# Patient Record
Sex: Female | Born: 1980 | Race: White | Hispanic: No | Marital: Married | Smoking: Former smoker
Health system: Southern US, Community
[De-identification: ages and names within clinical notes are randomized; demographics above are authoritative.]

## PROBLEM LIST (undated history)

## (undated) DIAGNOSIS — R87619 Unspecified abnormal cytological findings in specimens from cervix uteri: Secondary | ICD-10-CM

## (undated) HISTORY — PX: WISDOM TOOTH EXTRACTION: SHX21

## (undated) HISTORY — DX: Unspecified abnormal cytological findings in specimens from cervix uteri: R87.619

---
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
## 1999-11-17 ENCOUNTER — Other Ambulatory Visit: Admission: RE | Admit: 1999-11-17 | Discharge: 1999-11-17 | Payer: Self-pay | Admitting: *Deleted

## 2002-07-19 ENCOUNTER — Other Ambulatory Visit: Admission: RE | Admit: 2002-07-19 | Discharge: 2002-07-19 | Payer: Self-pay | Admitting: Obstetrics and Gynecology

## 2002-11-14 ENCOUNTER — Other Ambulatory Visit: Admission: RE | Admit: 2002-11-14 | Discharge: 2002-11-14 | Payer: Self-pay | Admitting: Obstetrics and Gynecology

## 2003-05-07 ENCOUNTER — Other Ambulatory Visit: Admission: RE | Admit: 2003-05-07 | Discharge: 2003-05-07 | Payer: Self-pay | Admitting: Obstetrics and Gynecology

## 2003-09-04 ENCOUNTER — Other Ambulatory Visit: Admission: RE | Admit: 2003-09-04 | Discharge: 2003-09-04 | Payer: Self-pay | Admitting: Obstetrics and Gynecology

## 2004-04-10 ENCOUNTER — Encounter: Admission: RE | Admit: 2004-04-10 | Discharge: 2004-04-10 | Payer: Self-pay | Admitting: *Deleted

## 2004-07-14 ENCOUNTER — Encounter (INDEPENDENT_AMBULATORY_CARE_PROVIDER_SITE_OTHER): Payer: Self-pay | Admitting: Specialist

## 2004-07-14 ENCOUNTER — Inpatient Hospital Stay (HOSPITAL_COMMUNITY): Admission: AD | Admit: 2004-07-14 | Discharge: 2004-07-18 | Payer: Self-pay | Admitting: *Deleted

## 2005-09-16 IMAGING — US US ABDOMEN COMPLETE
1 series · 14 of 25 positions shown · non-contrast
Comparison: none

CLINICAL DATA: Right upper quadrant pain.  The patient is 25 weeks pregnant.
 COMPLETE ABDOMINAL ULTRASOUND ? 04/10/04:
 The gallbladder is well visualized and appears normal.  Common bile duct is normal at 3.7 mm in diameter.  Liver parenchyma, inferior vena cava, pancreas, and spleen are normal.  Kidneys are normal.  The visualized portion of the abdominal aorta is normal.  Portions of the kidneys and abdominal aorta are partially obscured by the pregnancy.  Right kidney is 10.9 cm in length and the left kidney is 11.3 cm in length.

[Series 1: unknown · 0.30mm/px · 14 of 62 slices shown]
[im 1/62]
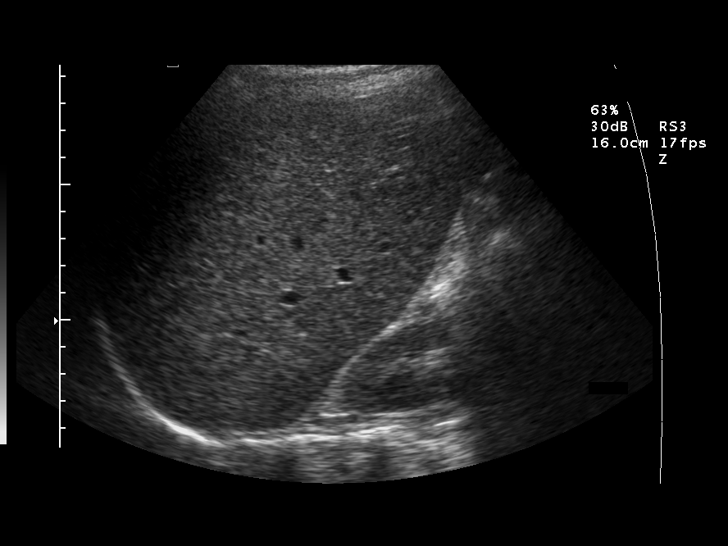
[im 6/62]
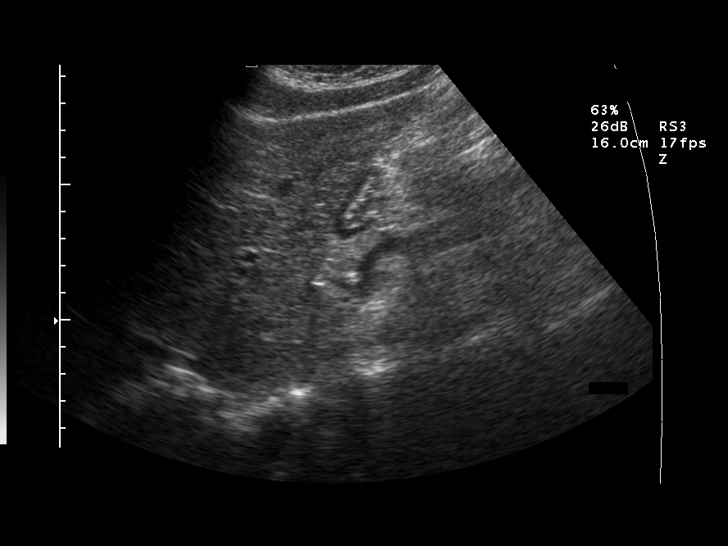
[im 11/62]
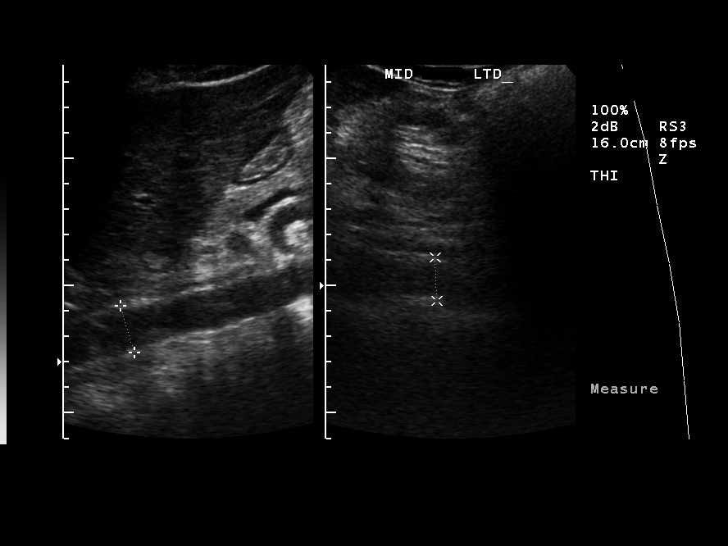
[im 16/62]
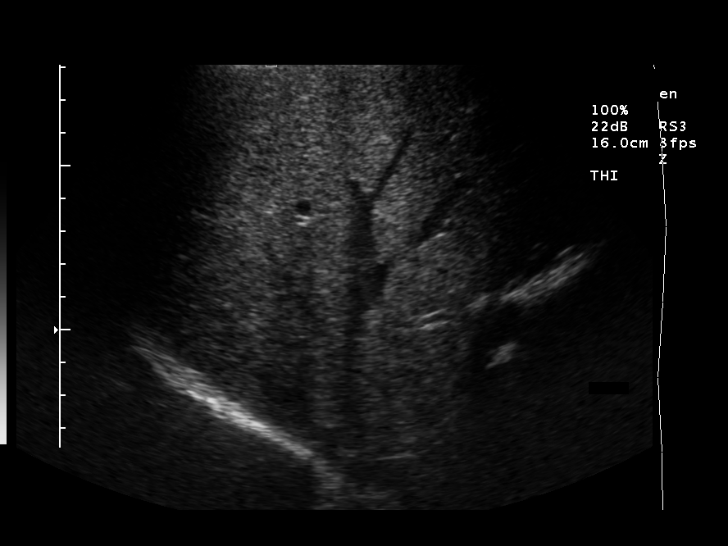
[im 21/62]
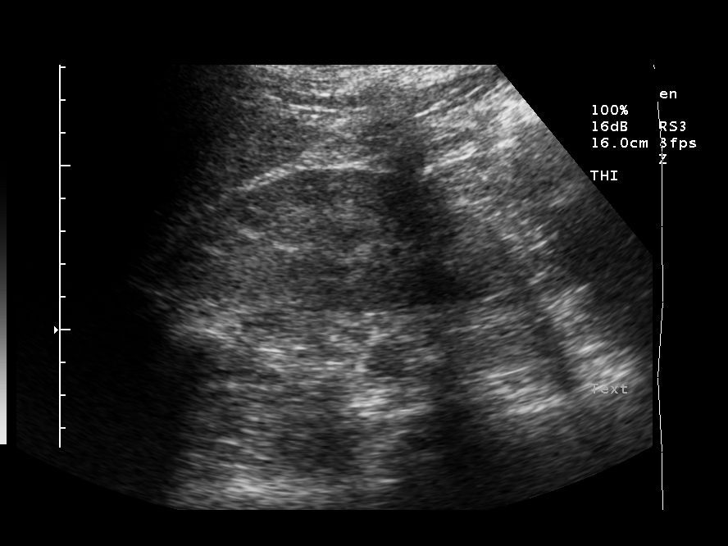
[im 23/62]
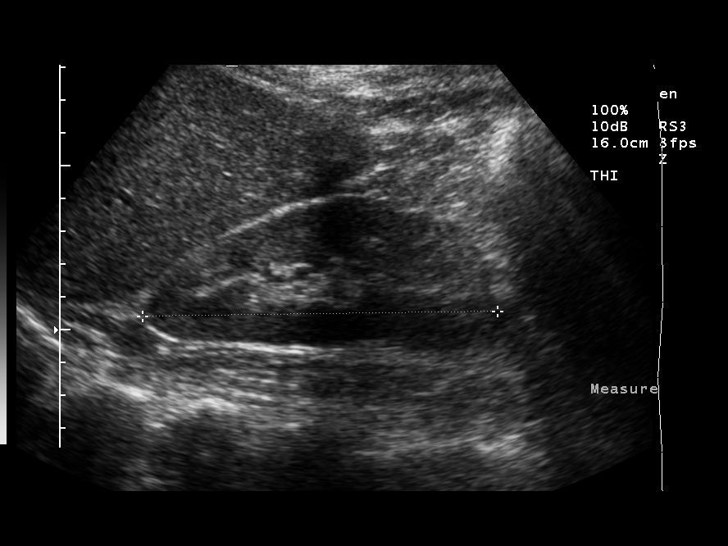
[im 28/62]
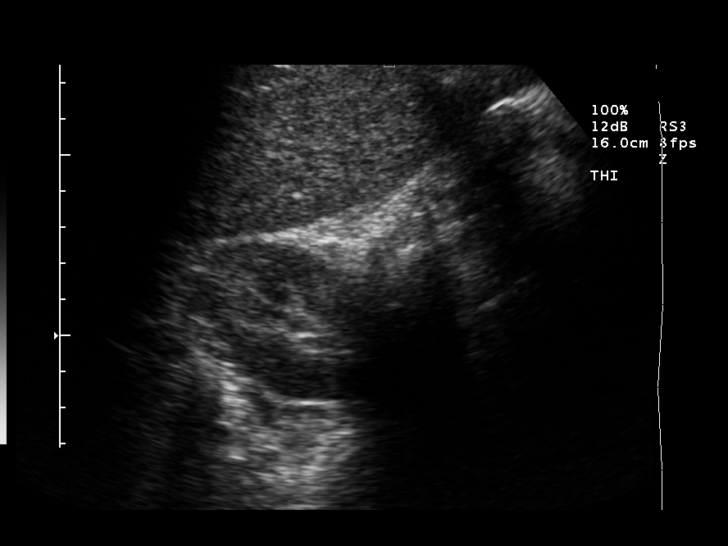
[im 34/62]
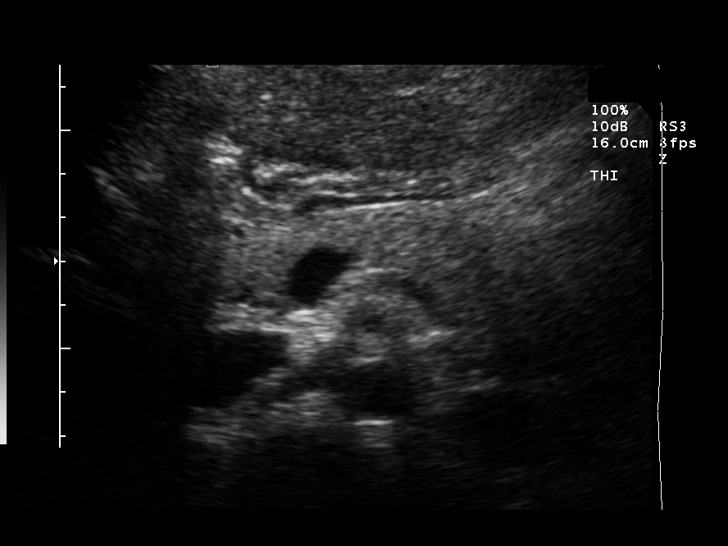
[im 39/62]
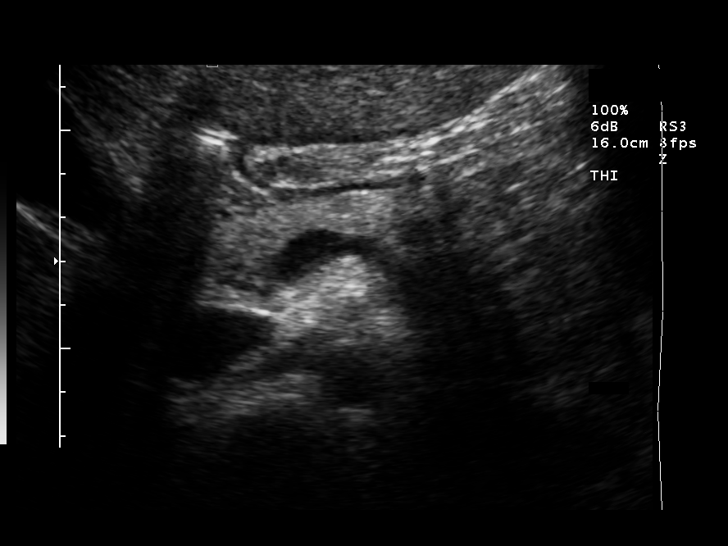
[im 41/62]
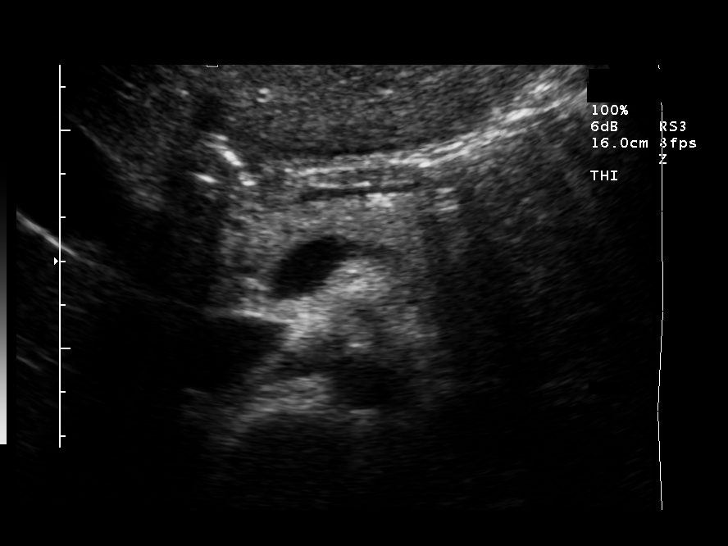
[im 46/62]
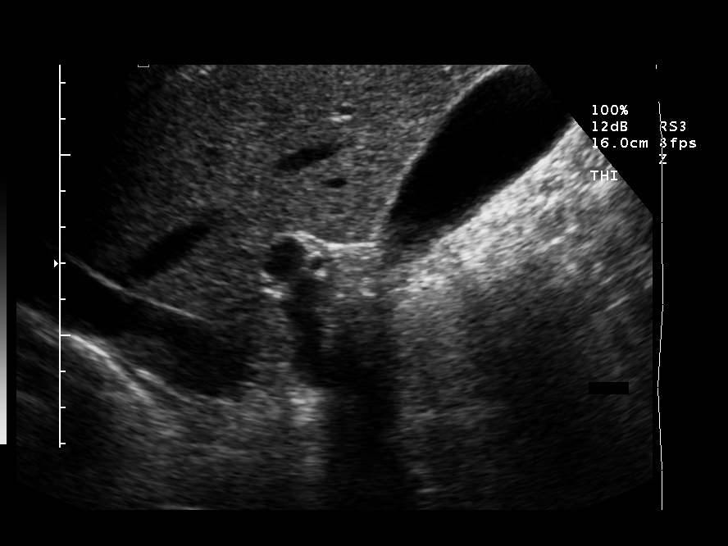
[im 51/62]
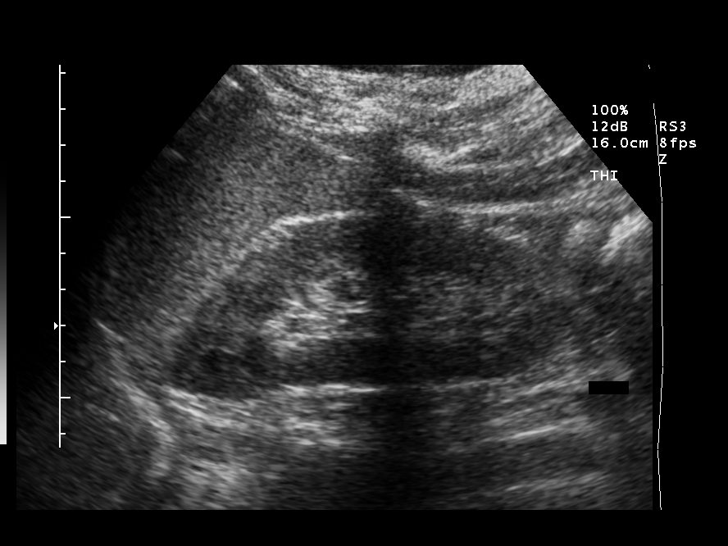
[im 56/62]
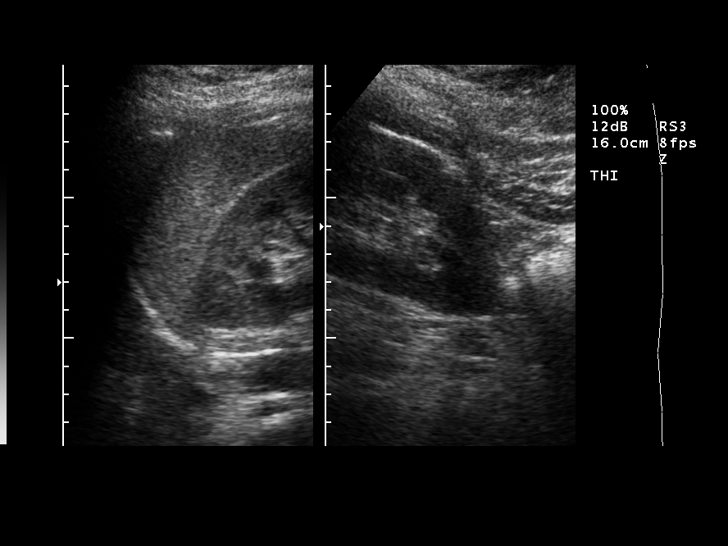
[im 62/62]
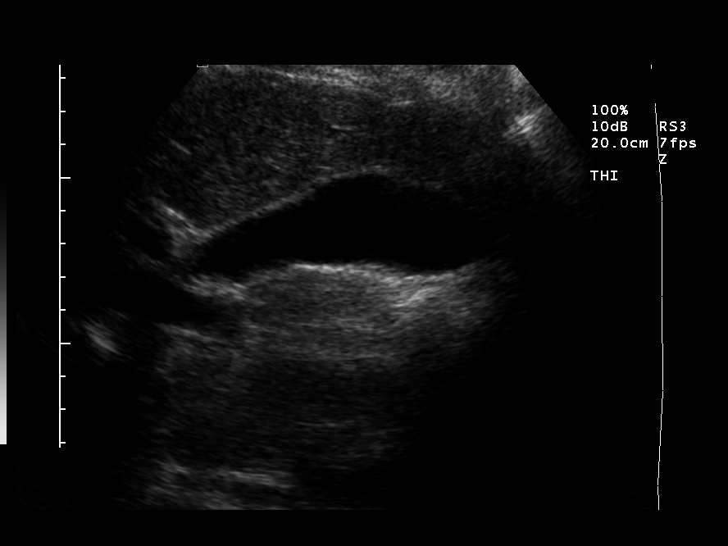

[14 of 25 positions shown; findings below may reference images not displayed]

IMPRESSION: Normal abdominal ultrasound.

## 2006-12-04 ENCOUNTER — Inpatient Hospital Stay (HOSPITAL_COMMUNITY): Admission: AD | Admit: 2006-12-04 | Discharge: 2006-12-06 | Payer: Self-pay | Admitting: Family Medicine

## 2006-12-04 ENCOUNTER — Encounter (INDEPENDENT_AMBULATORY_CARE_PROVIDER_SITE_OTHER): Payer: Self-pay | Admitting: Obstetrics & Gynecology

## 2010-10-03 ENCOUNTER — Encounter: Payer: Self-pay | Admitting: Emergency Medicine

## 2010-10-03 ENCOUNTER — Inpatient Hospital Stay (INDEPENDENT_AMBULATORY_CARE_PROVIDER_SITE_OTHER)
Admission: RE | Admit: 2010-10-03 | Discharge: 2010-10-03 | Disposition: A | Discharge status: Home / Self Care | Payer: BLUE CROSS/BLUE SHIELD | Source: Ambulatory Visit | Attending: Emergency Medicine | Admitting: Emergency Medicine

## 2010-10-03 DIAGNOSIS — J069 Acute upper respiratory infection, unspecified: Secondary | ICD-10-CM

## 2010-10-03 DIAGNOSIS — J209 Acute bronchitis, unspecified: Secondary | ICD-10-CM | POA: Insufficient documentation

## 2010-11-14 NOTE — H&P (Signed)
NAMEJENCY, Leah Browning                   ACCOUNT NO.:  [id number]   MEDICAL RECORD NO.:  [id number]          PATIENT TYPE:  MAT   LOCATION:  [facility]                          FACILITY:  [facility]   PHYSICIAN:  Lenoard Aden, M.D.DATE OF BIRTH:  03-Sep-1980   DATE OF ADMISSION:  07/14/2004  DATE OF DISCHARGE:                                HISTORY & PHYSICAL   CHIEF COMPLAINT:  Bleeding.   HISTORY OF PRESENT ILLNESS:  The patient is a 30 year old white female,  gravida 1, para 0, EDD July 21, 2004, who presents with heavy vaginal  bleeding, starting this evening.  Upon presentation to the hospital, she  does have a history of placenta previa followed by an ultrasound at 33 weeks  with a low-lying placenta about 1.5 cm from the cervical os.  She has had  some minor bloody show this afternoon followed by heavier bleeding as noted  this evening.   ALLERGIES:  No known drug allergies.   MEDICATIONS:  Prenatal vitamins.   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   FAMILY HISTORY:  Bladder cancer, heart disease, and diabetes.   PRENATAL LABORATORY DATA:  Blood type of A positive, Rh antibody negative,  rubella immune, hepatitis not available.   Prenatal course complicated for placenta previa as noted and an ultrasound  on June 04, 2004, consistent with a placenta that is posterior, but 1.2  cm from the cervical os.   PHYSICAL EXAMINATION:  GENERAL:  She is a well-developed, well-nourished,  white female in no acute distress.  The patient was complaining of some low  back pain.  HEENT:  Normal.  LUNGS:  Clear.  HEART:  Regular rate and rhythm.  ABDOMEN:  Soft, gravid, and nontender.  Estimated fetal weight 7.5 to 8  pounds.  PELVIC:  Cervix is 4 cm and 100% effaced.  Speculum examination reveals  about 50 mL of dark clotted blood with active bleeding noted.  Palpably  there is some questionable evidence of placenta palpated posteriorly and  upon palpation  of the lower posterior uterine segment, there is a large  amount of blood clot extruded from the cervical os.  EXTREMITIES:  No cords.  NEUROLOGY:  Nonfocal.   LABORATORY DATA:  CBC reveals a white blood cell count of 18.3, hemoglobin  14.7, hematocrit 42.9, and platelet count 261.   IMPRESSION:  1.  39-week pregnancy.  2.  Persistent placenta previa versus low-lying placenta with marginal      placental abruption and increased third trimester bleeding.  3.  Reassuring fetal surveillance noted.   PLAN:  Risks and benefits were discussed with the patient and we will  proceed with a primary cesarean section due to concerns regarding possible  placental abruption and/or persistent placenta previa.  The patient  acknowledges and will proceed.     Rich   RJT/MEDQ  D:  07/14/2004  T:  07/14/2004  Job:  [id number]

## 2010-11-14 NOTE — Op Note (Signed)
Leah Browning, CHATHAM                   ACCOUNT NO.:  [id number]   MEDICAL RECORD NO.:  [id number]          PATIENT TYPE:  MAT   LOCATION:  [facility]                          [facility]ILITY:  [facility]   PHYSICIAN:  Lenoard Aden, M.D.DATE OF BIRTH:  22-Jan-1981   DATE OF PROCEDURE:  07/15/2004  DATE OF DISCHARGE:                                 OPERATIVE REPORT   PREOPERATIVE DIAGNOSES:  A 39 week intra-uterine pregnancy, unexplained  third trimester bleeding, history of placenta previa versus possible  placental abruption.   POSTOPERATIVE DIAGNOSES:  A 39 week intra-uterine pregnancy, unexplained  third trimester bleeding, history of placenta previa versus possible  placental abruption, probable marginal placenta previa.   PROCEDURE:  Primary low transverse segment cesarean section.   SURGEON:  Dr. Billy Coast.   ANESTHESIA:  Spinal.   ESTIMATED BLOOD LOSS:  1500 cc.   COMPLICATIONS:  None.   DRAINS:  Foley.   COUNTS:  Correct.   The patient to recovery in good condition.   FINDINGS:  Full-time living female, occiput-anterior position, Apgars 8 and 9.  Cord pH 7.30, probable post marginal posterior placenta previa delivered  manually intact.  Three vessel cord.  Normal tubes and ovaries.  Uterus  closed in two layers.  The patient to recovery in good condition.  Placenta  to pathology.   BRIEF OPERATIVE NOTE:  Being apprised of the risks of anesthesia, infection,  bleeding, intra-abdominal injury and need for repair, the patient was  brought to the operating room where she was administered the spinal  anesthetic without complications, prepped and draped in the usual sterile  fashion.  The Foley catheter placed.  After achieving adequate anesthesia,  dilute Marcaine solution placed in the area of the Pfannenstiel skin  incision, then made with the scalpel and carried down to the fascia which  was nicked in the midline and extended transversely using Mayo scissors.  The rectus muscles  dissected sharply in the midline. The peritoneum entered  sharply, the bladder blade placed.  The visceral peritoneum scored in a  smile-like fashion.  A curved hysterotomy incision made, atraumatic delivery  of full-term living female, delivered with the assistance of a vacuum x1 pull,  handed to the pediatricians in attendance, Apgars 8 and 9.  Cord pH 7.30.  Placenta posterior as noted and prompt marginal placental previa detached  without difficulty.  Bleeding from the lower uterine segment is observed and  found to be well controlled after a period of observation with good, with  application of IV uterotonic's in the form of IV Pitocin.  Good harmonics  scalpel is noted, therefore the uterus is closed in two layers using 0  Monocryl suture, second imbricating layer placed.  The incision inspected  and found to be hemostatic.  The bladder flap inspected and found to be dry.  At this time, the uterus is replaced in the abdominal cavity.  Irrigation is  accomplished.  The fascia closed using the 0 Monocryl in a continuous  running fashion.  The skin closed using staples.  The patient tolerated the  procedure well and  is transferred to recovery in good condition.     Rich   RJT/MEDQ  D:  07/15/2004  T:  07/15/2004  Job:  [id number]

## 2010-11-14 NOTE — Discharge Summary (Signed)
NAMEJUDYTH, DEMARAIS                   ACCOUNT NO.:  [id number]   MEDICAL RECORD NO.:  [id number]          PATIENT TYPE:  INP   LOCATION:  9120                          [facility]ACILITY:  [facility]   PHYSICIAN:  Lenoard Aden, M.D.DATE OF BIRTH:  [birth year]   DATE OF ADMISSION:  07/14/2004  DATE OF DISCHARGE:  07/18/2004                                 DISCHARGE SUMMARY   HOSPITAL COURSE:  The patient underwent uncomplicated primary C-section on  July 15, 2004.  Postoperative course was uncomplicated.  She was  discharged to home on day #3.  Discharge teaching done.   CONDITION ON DISCHARGE:  Good.   DISCHARGE MEDICATIONS:  1.  Iron.  2.  Prenatal vitamins.  3.  Tylox.  4.  Iron.   FOLLOW UP:  Follow up in the office in 4-6 weeks.      RJT/MEDQ  D:  08/04/2004  T:  08/04/2004  Job:  [id number]

## 2011-04-16 LAB — CBC
HCT: 30.9 — ABNORMAL LOW
HCT: 39.1
Hemoglobin: 10.5 — ABNORMAL LOW
Hemoglobin: 13.2
MCHC: 33.7
MCHC: 33.9
MCV: 92.1
MCV: 92.9
Platelets: 174
Platelets: 208
RBC: 3.33 — ABNORMAL LOW
RBC: 4.25
RDW: 13
RDW: 13.2
WBC: 15.1 — ABNORMAL HIGH
WBC: 16.7 — ABNORMAL HIGH

## 2011-04-16 LAB — RPR: RPR Ser Ql: NONREACTIVE

## 2011-06-01 NOTE — Progress Notes (Signed)
Summary: COLD SYMPTOMS/WSE rm 4   Vital Signs:  Patient Profile:   30 Years Old Female CC:      Cold & URI symptoms Height:     66 inches Weight:      135.25 pounds O2 Sat:      98 % O2 treatment:    Room Air Temp:     99.5 degrees F oral Pulse rate:   83 / minute Resp:     18 per minute BP sitting:   93 / 47  (left arm) Cuff size:   regular  Vitals Entered By: Clemens Catholic LPN (October 03, 7827 1:06 PM)                  Updated Prior Medication List: No Medications Current Allergies: No known allergies History of Present Illness History from: patient Chief Complaint: Cold & URI symptoms History of Present Illness: 30 Years Old Female complains of onset of cold symptoms for 2-3 days.  Teffany has been using Mucinex, Sudafed, and OTC cough med which is helping a little bit. No sore throat + cough No pleuritic pain No wheezing +nasal congestion +post-nasal drainage + sinus pain/pressure + chest congestion No itchy/red eyes No earache No hemoptysis No SOB No chills/sweats + fever No nausea No vomiting No abdominal pain No diarrhea No skin rashes No fatigue No myalgias No headache   REVIEW OF SYSTEMS Constitutional Symptoms       Complains of fever, chills, and night sweats.     Denies weight loss, weight gain, and fatigue.  Eyes       Denies change in vision, eye pain, eye discharge, glasses, contact lenses, and eye surgery.      Comments: watery eyes Ear/Nose/Throat/Mouth       Complains of frequent runny nose, sinus problems, and hoarseness.      Denies hearing loss/aids, change in hearing, ear pain, ear discharge, dizziness, frequent nose bleeds, sore throat, and tooth pain or bleeding.  Respiratory       Complains of productive cough and wheezing.      Denies dry cough, shortness of breath, asthma, bronchitis, and emphysema/COPD.  Cardiovascular       Complains of chest pain and tires easily with exhertion.      Denies murmurs.    Gastrointestinal       Denies stomach pain, nausea/vomiting, diarrhea, constipation, blood in bowel movements, and indigestion. Genitourniary       Denies painful urination, kidney stones, and loss of urinary control. Neurological       Complains of headaches.      Denies paralysis, seizures, and fainting/blackouts. Musculoskeletal       Complains of muscle pain and joint pain.      Denies joint stiffness, decreased range of motion, redness, swelling, muscle weakness, and gout.  Skin       Denies bruising, unusual mles/lumps or sores, and hair/skin or nail changes.  Psych       Complains of anxiety/stress and depression.      Denies mood changes, temper/anger issues, speech problems, and sleep problems. Other Comments: pt c/o cough, chest congestion, fever last night, chest hurts to cough, runny nose and watery eyes x 2days. she has a hx of pneumonia. she has taken IBF, Sudafed, and Mucinex with no relief.   Past History:  Past Medical History: Unremarkable  Past Surgical History: Caesarean section 2006  Family History: father- heart dz, diabetes, Family History High cholesterol Family History Hypertension Family History  Depression mother- Family History Thyroid disease high cholestrol and diabetes  Social History: Never Smoked Alcohol use-no Drug use-no Smoking Status:  never Drug Use:  no Physical Exam General appearance: well developed, well nourished, no acute distress Ears: normal, no lesions or deformities Nasal: mucosa pink, nonedematous, no septal deviation, turbinates normal Oral/Pharynx: tongue normal, posterior pharynx without erythema or exudate Chest/Lungs: no rales, wheezes, or rhonchi bilateral, breath sounds equal without effort Heart: regular rate and  rhythm, no murmur MSE: oriented to time, place, and person Assessment New Problems: BRONCHITIS, ACUTE (ICD-466.0) FAMILY HISTORY DEPRESSION (ICD-V17.0) UPPER RESPIRATORY INFECTION, ACUTE (ICD-465.9)   Plan New  Medications/Changes: ZITHROMAX Z-PAK 250 MG TABS (AZITHROMYCIN) use as directed  #1 x 0, 10/03/2010, Hoyt Koch MD  New Orders: New Patient Level III [phone] Pulse Oximetry (single measurment) [94760] Planning Comments:   1)  Take the prescribed antibiotic as instructed. Should wait 1-2 days prior to taking 2)  Use nasal saline solution (over the counter) at least 3 times a day. 3)  Use over the counter decongestants like Zyrtec-D every 12 hours as needed to help with congestion. 4)  Can take tylenol every 6 hours or motrin every 8 hours for pain or fever. 5)  Follow up with your primary doctor  if no improvement in 5-7 days, sooner if increasing pain, fever, or new symptoms.   If worsening, would suggest CXR since you've had pneumonia in the past.   The patient and/or caregiver has been counseled thoroughly with regard to medications prescribed including dosage, schedule, interactions, rationale for use, and possible side effects and they verbalize understanding.  Diagnoses and expected course of recovery discussed and will return if not improved as expected or if the condition worsens. Patient and/or caregiver verbalized understanding.  Prescriptions: ZITHROMAX Z-PAK 250 MG TABS (AZITHROMYCIN) use as directed  #1 x 0   Entered and Authorized by:   Hoyt Koch MD   Signed by:   Hoyt Koch MD on 10/03/2010   Method used:   Print then Give to Patient   RxID:   [id number]   Orders Added: 1)  New Patient Level III [72536] 2)  Pulse Oximetry (single measurment) [64403]

## 2015-06-27 ENCOUNTER — Encounter: Payer: Self-pay | Admitting: Family Medicine

## 2015-07-08 ENCOUNTER — Encounter: Payer: Self-pay | Admitting: Obstetrics & Gynecology

## 2015-07-11 ENCOUNTER — Ambulatory Visit (INDEPENDENT_AMBULATORY_CARE_PROVIDER_SITE_OTHER): Payer: BLUE CROSS/BLUE SHIELD | Admitting: Obstetrics & Gynecology

## 2015-07-11 ENCOUNTER — Encounter: Payer: Self-pay | Admitting: Obstetrics & Gynecology

## 2015-07-11 VITALS — BP 104/49 | HR 65 | Resp 16 | Ht 66.0 in | Wt 163.0 lb

## 2015-07-11 DIAGNOSIS — Z124 Encounter for screening for malignant neoplasm of cervix: Secondary | ICD-10-CM | POA: Diagnosis not present

## 2015-07-11 DIAGNOSIS — Z1151 Encounter for screening for human papillomavirus (HPV): Secondary | ICD-10-CM | POA: Diagnosis not present

## 2015-07-11 DIAGNOSIS — Z0000 Encounter for general adult medical examination without abnormal findings: Secondary | ICD-10-CM

## 2015-07-11 DIAGNOSIS — Z01419 Encounter for gynecological examination (general) (routine) without abnormal findings: Secondary | ICD-10-CM | POA: Diagnosis not present

## 2015-07-11 NOTE — Progress Notes (Signed)
Subjective:    Leah Browning is a 35 y.o. MW P2 with step son also(20, 5211, and 138 yo sons) female who presents for an annual exam. The patient has no complaints today. She has some intermittent bilateral hip pain. She takes no meds. It self resolves. It started about 4-5 monthes. The patient is sexually active. GYN screening history: last pap: was normal. The patient wears seatbelts: yes. The patient participates in regular exercise: no. Has the patient ever been transfused or tattooed?: yes.(tatoo)  The patient reports that there is not domestic violence in her life.   Menstrual History: OB History    Gravida Para Term Preterm AB TAB SAB Ectopic Multiple Living   2 2 2       2       Menarche age: [age]  Patient's last menstrual period was 06/24/2015.    The following portions of the patient's history were reviewed and updated as appropriate: allergies, current medications, past family history, past medical history, past social history, past surgical history and problem list.  Review of Systems Pertinent items noted in HPI and remainder of comprehensive ROS otherwise negative. Declines flu vaccine. Husband with vasectomy. Self-employed as an Health visitorart therapist, also grad school through [employer] in [place]   Objective:    BP 104/49 mmHg  Pulse 65  Resp 16  Ht 5\' 6"  (1.676 m)  Wt 163 lb (73.936 kg)  BMI 26.32 kg/m2  LMP 06/24/2015  General Appearance:    Alert, cooperative, no distress, appears stated age  Head:    Normocephalic, without obvious abnormality, atraumatic  Eyes:    PERRL, conjunctiva/corneas clear, EOM's intact, fundi    benign, both eyes  Ears:    Normal TM's and external ear canals, both ears  Nose:   Nares normal, septum midline, mucosa normal, no drainage    or sinus tenderness  Throat:   Lips, mucosa, and tongue normal; teeth and gums normal  Neck:   Supple, symmetrical, trachea midline, no adenopathy;    thyroid:  no enlargement/tenderness/nodules; no carotid   bruit  or JVD  Back:     Symmetric, no curvature, ROM normal, no CVA tenderness  Lungs:     Clear to auscultation bilaterally, respirations unlabored  Chest Wall:    No tenderness or deformity   Heart:    Regular rate and rhythm, S1 and S2 normal, no murmur, rub   or gallop  Breast Exam:    No tenderness, masses, or nipple abnormality  Abdomen:     Soft, non-tender, bowel sounds active all four quadrants,    no masses, no organomegaly  Genitalia:    Normal female without lesion, discharge or tenderness, shaved,  NSSA, NT, mobile, normal adnexal exam     Extremities:   Extremities normal, atraumatic, no cyanosis or edema  Pulses:   2+ and symmetric all extremities  Skin:   Skin color, texture, turgor normal, no rashes or lesions  Lymph nodes:   Cervical, supraclavicular, and axillary nodes normal  Neurologic:   CNII-XII intact, normal strength, sensation and reflexes    throughout  .    Assessment:    Healthy female exam.    Plan:     Breast self exam technique reviewed and patient encouraged to perform self-exam monthly. Thin prep Pap smear. with cotesting

## 2015-07-16 LAB — CYTOLOGY - PAP

## 2016-05-20 ENCOUNTER — Encounter: Payer: Self-pay | Admitting: Obstetrics & Gynecology

## 2016-05-20 ENCOUNTER — Ambulatory Visit (INDEPENDENT_AMBULATORY_CARE_PROVIDER_SITE_OTHER): Payer: Self-pay | Admitting: Obstetrics & Gynecology

## 2016-05-20 VITALS — BP 102/62 | HR 73 | Ht 66.0 in | Wt 171.0 lb

## 2016-05-20 DIAGNOSIS — N9089 Other specified noninflammatory disorders of vulva and perineum: Secondary | ICD-10-CM

## 2016-05-20 DIAGNOSIS — N898 Other specified noninflammatory disorders of vagina: Secondary | ICD-10-CM

## 2016-05-20 NOTE — Progress Notes (Signed)
   Subjective:    Patient ID: Leah Browning, female    DOB: 11/04/1980, 35 y.o.   MRN: [id number]  HPI  35 yo MW P2 here with the complaint of 2 right vulvar bumps and a sore area just left of her urethra. These showed up about 3-4 days ago. She has been cleaning them with apple cider vinegar. She has been married for 16 year, monogamous.  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

## 2016-05-21 LAB — WET PREP FOR TRICH, YEAST, CLUE
Trich, Wet Prep: NONE SEEN
Yeast Wet Prep HPF POC: NONE SEEN

## 2016-05-22 LAB — HSV 2 ANTIBODY, IGG: HSV 2 Glycoprotein G Ab, IgG: <0.9 Index (ref <0.90)

## 2016-05-25 ENCOUNTER — Telehealth: Payer: Self-pay

## 2016-05-25 DIAGNOSIS — B9689 Other specified bacterial agents as the cause of diseases classified elsewhere: Secondary | ICD-10-CM

## 2016-05-25 DIAGNOSIS — N760 Acute vaginitis: Principal | ICD-10-CM

## 2016-05-25 MED ORDER — METRONIDAZOLE 500 MG PO TABS: 500.0000 mg | ORAL_TABLET | Freq: Two times a day (BID) | ORAL | 0 refills | Status: DC

## 2016-05-25 NOTE — Telephone Encounter (Signed)
Left message for pt to call office for lab results

## 2016-05-26 ENCOUNTER — Telehealth: Payer: Self-pay | Admitting: *Deleted

## 2016-05-26 NOTE — Telephone Encounter (Signed)
Pt notified of normal HSV and she does have BV and appropriate med was sent to her pharmacy.

## 2018-04-19 ENCOUNTER — Other Ambulatory Visit: Payer: Self-pay

## 2018-04-19 ENCOUNTER — Emergency Department (INDEPENDENT_AMBULATORY_CARE_PROVIDER_SITE_OTHER)
Admission: EM | Admit: 2018-04-19 | Discharge: 2018-04-19 | Disposition: A | Discharge status: Home / Self Care | Payer: PRIVATE HEALTH INSURANCE | Source: Home / Self Care | Attending: Family Medicine | Admitting: Family Medicine

## 2018-04-19 ENCOUNTER — Encounter: Payer: Self-pay | Admitting: *Deleted

## 2018-04-19 DIAGNOSIS — H9312 Tinnitus, left ear: Secondary | ICD-10-CM

## 2018-04-19 NOTE — ED Provider Notes (Signed)
[facility]    CSN: [id number] Arrival date & time: 04/19/18  1917     History   Chief Complaint Chief Complaint  Patient presents with  . Tinnitus    HPI Leah Browning is a 37 y.o. female.   About 7 hours ago while sitting typing, patient suddenly developed mild ringing in her left ear, muffled hearing in her left ear, and vague sensation of dizziness.  No nausea/vomiting.  No fevers, chills, and sweats.  She feels well otherwise.  No recent URI or sinus congestion.     Past Medical History:  Diagnosis Date  . Abnormal Pap smear of cervix     Patient Active Problem List   Diagnosis Date Noted  . BRONCHITIS, ACUTE 10/03/2010    Past Surgical History:  Procedure Laterality Date  . CESAREAN SECTION    . WISDOM TOOTH EXTRACTION      OB History    Gravida  2   Para  2   Term  2   Preterm      AB      Living  2     SAB      TAB      Ectopic      Multiple      Live Births               Home Medications    Prior to Admission medications   Not on File    Family History Family History  Problem Relation Age of Onset  . Hyperlipidemia Mother   . Cancer Mother        bladder  . Fibrocystic breast disease Mother   . Hypertension Mother   . Diabetes Father   . Hyperlipidemia Father   . Kidney disease Father   . Hypertension Father   . Cancer - Lung Paternal Grandfather   . Heart failure Paternal Grandmother   . Heart attack Maternal Grandfather   . Kidney disease Maternal Grandmother     Social History Social History   Tobacco Use  . Smoking status: Former Smoker    Packs/day: 1.00    Years: 4.00    Pack years: 4.00    Types: Cigarettes  . Smokeless tobacco: Never Used  Substance Use Topics  . Alcohol use: No    Alcohol/week: 0.0 standard drinks  . Drug use: No     Allergies   Patient has no known allergies.   Review of Systems Review of Systems No sore throat No cough No pleuritic pain No  wheezing No nasal congestion No post-nasal drainage No sinus pain/pressure No itchy/red eyes No earache + tinnitus No hemoptysis No SOB No fever,chills No nausea No vomiting No abdominal pain No diarrhea No urinary symptoms No skin rash No fatigue No myalgias No headache    Physical Exam Triage Vital Signs ED Triage Vitals [04/19/18 2007]  Enc Vitals Group     BP 114/76     Pulse Rate 60     Resp 18     Temp 98.6 F (37 C)     Temp Source Oral     SpO2 99 %     Weight 193 lb (87.5 kg)     Height 5\' 6"  (1.676 m)     Head Circumference      Peak Flow      Pain Score 0     Pain Loc      Pain Edu?      Excl. in GC?  No data found.  Updated Vital Signs BP 114/76 (BP Location: Right Arm)   Pulse 60   Temp 98.6 F (37 C) (Oral)   Resp 18   Ht 5\' 6"  (1.676 m)   Wt 87.5 kg   SpO2 99%   BMI 31.15 kg/m   Visual Acuity Right Eye Distance:   Left Eye Distance:   Bilateral Distance:    Right Eye Near:   Left Eye Near:    Bilateral Near:     Physical Exam Nursing notes and Vital Signs reviewed. Appearance:  Patient appears stated age, and in no acute distress Eyes:  Pupils are equal, round, and reactive to light and accomodation.  Extraocular movement is intact.  Conjunctivae are not inflamed.  No nystagmus.  Ears:  Canals normal.  Tympanic membranes normal.  Nose:   Normal turbinates.  No sinus tenderness.    Pharynx:  Normal Neck:  Supple.  No adenopathy.  Carotids have normal upstrokes.  Lungs:   Normal respiration Heart: Normal rate Skin:  No rash present.    UC Treatments / Results  Labs  (all labs ordered are listed, but only abnormal results are displayed) Labs Reviewed -  Tympanometry:  Right ear tympanogram normal; Left ear tympanogram normal  EKG None  Radiology No results found.  Procedures Procedures (including critical care time)  Medications Ordered in UC Medications - No data to display  Initial Impression / Assessment  and Plan / UC Course  I have reviewed the triage vital signs and the nursing notes.  Pertinent labs & imaging results that were available during my care of the patient were reviewed by me and considered in my medical decision making (see chart for details).    No evidence otitis media. ?Meniere's Disease Recommend ENT evaluation   Final Clinical Impressions(s) / UC Diagnoses   Final diagnoses:  Tinnitus of left ear   Discharge Instructions   None    ED Prescriptions    None        Lattie Haw, MD 04/21/18 1341

## 2018-04-19 NOTE — ED Triage Notes (Signed)
Pt c/o LT ear ringing and feeling "swimmy headed, not dizzy" x 1330 today. Denies pain or fever.

## 2018-04-20 ENCOUNTER — Telehealth: Payer: Self-pay
